# Patient Record
Sex: Female | Born: 1965 | Race: White | Hispanic: No | Marital: Married | Smoking: Never smoker
Health system: Southern US, Community
[De-identification: ages and names within clinical notes are randomized; demographics above are authoritative.]

## PROBLEM LIST (undated history)

## (undated) DIAGNOSIS — E079 Disorder of thyroid, unspecified: Secondary | ICD-10-CM

## (undated) DIAGNOSIS — I10 Essential (primary) hypertension: Secondary | ICD-10-CM

---

## 1998-05-28 ENCOUNTER — Other Ambulatory Visit: Admission: RE | Admit: 1998-05-28 | Discharge: 1998-05-28 | Payer: Self-pay | Admitting: *Deleted

## 1999-06-25 ENCOUNTER — Other Ambulatory Visit: Admission: RE | Admit: 1999-06-25 | Discharge: 1999-06-25 | Payer: Self-pay | Admitting: Obstetrics and Gynecology

## 1999-07-23 ENCOUNTER — Ambulatory Visit (HOSPITAL_COMMUNITY): Admission: RE | Admit: 1999-07-23 | Discharge: 1999-07-23 | Payer: Self-pay | Admitting: Obstetrics and Gynecology

## 1999-07-23 ENCOUNTER — Encounter: Payer: Self-pay | Admitting: Obstetrics and Gynecology

## 2000-06-24 ENCOUNTER — Other Ambulatory Visit: Admission: RE | Admit: 2000-06-24 | Discharge: 2000-06-24 | Payer: Self-pay | Admitting: Obstetrics and Gynecology

## 2000-08-12 ENCOUNTER — Encounter: Payer: Self-pay | Admitting: Obstetrics and Gynecology

## 2000-08-12 ENCOUNTER — Ambulatory Visit (HOSPITAL_COMMUNITY): Admission: RE | Admit: 2000-08-12 | Discharge: 2000-08-12 | Payer: Self-pay | Admitting: Obstetrics and Gynecology

## 2003-03-11 ENCOUNTER — Emergency Department (HOSPITAL_COMMUNITY): Admission: EM | Admit: 2003-03-11 | Discharge: 2003-03-11 | Payer: Self-pay | Admitting: Emergency Medicine

## 2003-03-11 ENCOUNTER — Encounter: Payer: Self-pay | Admitting: Emergency Medicine

## 2003-04-08 ENCOUNTER — Encounter: Admission: RE | Admit: 2003-04-08 | Discharge: 2003-04-08 | Payer: Self-pay | Admitting: Family Medicine

## 2004-12-15 ENCOUNTER — Encounter: Admission: RE | Admit: 2004-12-15 | Discharge: 2004-12-15 | Payer: Self-pay | Admitting: Family Medicine

## 2005-12-22 ENCOUNTER — Encounter: Admission: RE | Admit: 2005-12-22 | Discharge: 2005-12-22 | Payer: Self-pay | Admitting: Family Medicine

## 2008-10-02 ENCOUNTER — Encounter: Admission: RE | Admit: 2008-10-02 | Discharge: 2008-10-02 | Payer: Self-pay | Admitting: Internal Medicine

## 2010-06-15 ENCOUNTER — Encounter: Payer: Self-pay | Admitting: Internal Medicine

## 2012-03-06 ENCOUNTER — Telehealth: Payer: Self-pay | Admitting: *Deleted

## 2012-03-06 NOTE — Telephone Encounter (Signed)
Chart opened in error

## 2013-08-23 ENCOUNTER — Other Ambulatory Visit: Payer: Self-pay | Admitting: Obstetrics and Gynecology

## 2013-08-23 DIAGNOSIS — R928 Other abnormal and inconclusive findings on diagnostic imaging of breast: Secondary | ICD-10-CM

## 2013-09-05 ENCOUNTER — Ambulatory Visit
Admission: RE | Admit: 2013-09-05 | Discharge: 2013-09-05 | Disposition: A | Discharge status: Home / Self Care | Payer: BC Managed Care – PPO | Source: Ambulatory Visit | Attending: Obstetrics and Gynecology | Admitting: Obstetrics and Gynecology

## 2013-09-05 DIAGNOSIS — R928 Other abnormal and inconclusive findings on diagnostic imaging of breast: Secondary | ICD-10-CM

## 2014-02-07 ENCOUNTER — Other Ambulatory Visit: Payer: Self-pay | Admitting: Orthopedic Surgery

## 2014-02-07 DIAGNOSIS — M545 Low back pain, unspecified: Secondary | ICD-10-CM

## 2014-02-18 ENCOUNTER — Ambulatory Visit
Admission: RE | Admit: 2014-02-18 | Discharge: 2014-02-18 | Disposition: A | Discharge status: Home / Self Care | Payer: BC Managed Care – PPO | Source: Ambulatory Visit | Attending: Orthopedic Surgery | Admitting: Orthopedic Surgery

## 2014-02-18 DIAGNOSIS — M545 Low back pain, unspecified: Secondary | ICD-10-CM

## 2014-06-16 ENCOUNTER — Emergency Department (HOSPITAL_BASED_OUTPATIENT_CLINIC_OR_DEPARTMENT_OTHER): Payer: BLUE CROSS/BLUE SHIELD

## 2014-06-16 ENCOUNTER — Encounter (HOSPITAL_BASED_OUTPATIENT_CLINIC_OR_DEPARTMENT_OTHER): Payer: Self-pay | Admitting: *Deleted

## 2014-06-16 ENCOUNTER — Emergency Department (HOSPITAL_BASED_OUTPATIENT_CLINIC_OR_DEPARTMENT_OTHER)
Admission: EM | Admit: 2014-06-16 | Discharge: 2014-06-16 | Disposition: A | Discharge status: Home / Self Care | Payer: BLUE CROSS/BLUE SHIELD | Attending: Emergency Medicine | Admitting: Emergency Medicine

## 2014-06-16 DIAGNOSIS — W000XXA Fall on same level due to ice and snow, initial encounter: Secondary | ICD-10-CM | POA: Insufficient documentation

## 2014-06-16 DIAGNOSIS — Y998 Other external cause status: Secondary | ICD-10-CM | POA: Insufficient documentation

## 2014-06-16 DIAGNOSIS — S52611A Displaced fracture of right ulna styloid process, initial encounter for closed fracture: Secondary | ICD-10-CM | POA: Diagnosis not present

## 2014-06-16 DIAGNOSIS — Z79899 Other long term (current) drug therapy: Secondary | ICD-10-CM | POA: Diagnosis not present

## 2014-06-16 DIAGNOSIS — Y9389 Activity, other specified: Secondary | ICD-10-CM | POA: Insufficient documentation

## 2014-06-16 DIAGNOSIS — I10 Essential (primary) hypertension: Secondary | ICD-10-CM | POA: Insufficient documentation

## 2014-06-16 DIAGNOSIS — W19XXXA Unspecified fall, initial encounter: Secondary | ICD-10-CM

## 2014-06-16 DIAGNOSIS — S52591A Other fractures of lower end of right radius, initial encounter for closed fracture: Secondary | ICD-10-CM | POA: Insufficient documentation

## 2014-06-16 DIAGNOSIS — S6991XA Unspecified injury of right wrist, hand and finger(s), initial encounter: Secondary | ICD-10-CM | POA: Diagnosis present

## 2014-06-16 DIAGNOSIS — E079 Disorder of thyroid, unspecified: Secondary | ICD-10-CM | POA: Diagnosis not present

## 2014-06-16 DIAGNOSIS — S52501A Unspecified fracture of the lower end of right radius, initial encounter for closed fracture: Secondary | ICD-10-CM

## 2014-06-16 DIAGNOSIS — Y9289 Other specified places as the place of occurrence of the external cause: Secondary | ICD-10-CM | POA: Insufficient documentation

## 2014-06-16 HISTORY — DX: Disorder of thyroid, unspecified: E07.9

## 2014-06-16 HISTORY — DX: Essential (primary) hypertension: I10

## 2014-06-16 MED ORDER — ONDANSETRON 4 MG PO TBDP
4.0000 mg | ORAL_TABLET | Freq: Once | ORAL | Status: AC
  Administered 2014-06-16: 4 mg via ORAL
  Filled 2014-06-16: qty 1

## 2014-06-16 MED ORDER — MORPHINE SULFATE 4 MG/ML IJ SOLN
4.0000 mg | Freq: Once | INTRAMUSCULAR | Status: AC
  Administered 2014-06-16: 4 mg via INTRAMUSCULAR
  Filled 2014-06-16: qty 1

## 2014-06-16 MED ORDER — OXYCODONE-ACETAMINOPHEN 5-325 MG PO TABS
ORAL_TABLET | ORAL | Status: AC
Start: 2014-06-16 — End: ?

## 2014-06-16 NOTE — Discharge Instructions (Signed)
Rest, Ice intermittently (in the first 24-48 hours),  and elevate (Limb above the level of the heart)   Take up to 800mg  of ibuprofen (that is usually 4 over the counter pills)  3 times a day for 5 days. Take with food.  Take percocet for breakthrough pain, do not drink alcohol, drive, care for children or do other critical tasks while taking percocet.  Please follow with your primary care doctor in the next 2 days for a check-up. They must obtain records for further management.   Do not hesitate to return to the Emergency Department for any new, worsening or concerning symptoms.   Radial Fracture You have a broken bone (fracture) of the forearm. This is the part of your arm between the elbow and your wrist. Your forearm is made up of two bones. These are the radius and ulna. Your fracture is in the radial shaft. This is the bone in your forearm located on the thumb side. A cast or splint is used to protect and keep your injured bone from moving. The cast or splint will be on generally for about 5 to 6 weeks, with individual variations. HOME CARE INSTRUCTIONS   Keep the injured part elevated while sitting or lying down. Keep the injury above the level of your heart (the center of the chest). This will decrease swelling and pain.  Apply ice to the injury for 15-20 minutes, 03-04 times per day while awake, for 2 days. Put the ice in a plastic bag and place a towel between the bag of ice and your cast or splint.  Move your fingers to avoid stiffness and minimize swelling.  If you have a plaster or fiberglass cast:  Do not try to scratch the skin under the cast using sharp or pointed objects.  Check the skin around the cast every day. You may put lotion on any red or sore areas.  Keep your cast dry and clean.  If you have a plaster splint:  Wear the splint as directed.  You may loosen the elastic around the splint if your fingers become numb, tingle, or turn cold or blue.  Do not put  pressure on any part of your cast or splint. It may break. Rest your cast only on a pillow for the first 24 hours until it is fully hardened.  Your cast or splint can be protected during bathing with a plastic bag. Do not lower the cast or splint into water.  Only take over-the-counter or prescription medicines for pain, discomfort, or fever as directed by your caregiver. SEEK IMMEDIATE MEDICAL CARE IF:   Your cast gets damaged or breaks.  You have more severe pain or swelling than you did before getting the cast.  You have severe pain when stretching your fingers.  There is a bad smell, new stains and/or pus-like (purulent) drainage coming from under the cast.  Your fingers or hand turn pale or blue and become cold or your loose feeling. Document Released: 10/21/2005 Document Revised: 08/02/2011 Document Reviewed: 01/17/2006 [facility] Patient Information 2015 [place]. This information is not intended to replace advice given to you by your health care provider. Make sure you discuss any questions you have with your health care provider.

## 2014-06-16 NOTE — ED Provider Notes (Signed)
CSN: [id number]     Arrival date & time 06/16/14  1125 History   First MD Initiated Contact with Patient 06/16/14 1210     Chief Complaint  Patient presents with  . Fall  . Wrist Injury     (Consider location/radiation/quality/duration/timing/severity/associated sxs/prior Treatment) HPI   Colleen Espinoza is a 49 y.o. female complaining of 8 out of 10 right wrist pain status post slip and fall on ice prior to arrival. Pain medication taken prior to arrival. Pain is exacerbated by movement and palpation. Patient states that she slipped and fell towards her right side and impacted the hand in position of ulnar deviation. Patient denies numbness, weakness, significantly reduced range of motion. Patient is right-hand-dominant and works as a Production designer, theatre/television/filmmanager for [employer] and work involves lifting and typing frequently.  Past Medical History  Diagnosis Date  . Hypertension   . Thyroid disease    History reviewed. No pertinent past surgical history. No family history on file. History  Substance Use Topics  . Smoking status: Never Smoker   . Smokeless tobacco: Not on file  . Alcohol Use: Not on file   OB History    No data available     Review of Systems  10 systems reviewed and found to be negative, except as noted in the HPI.   Allergies  Review of patient's allergies indicates no known allergies.  Home Medications   Prior to Admission medications   Medication Sig Start Date End Date Taking? Authorizing Provider  buPROPion (WELLBUTRIN SR) 200 MG 12 hr tablet Take 200 mg by mouth 2 (two) times daily.   Yes Historical Provider, MD  estrogens conjugated, synthetic A, (CENESTIN) 0.3 MG tablet Take 0.3 mg by mouth daily.   Yes Historical Provider, MD  levothyroxine (SYNTHROID, LEVOTHROID) 100 MCG tablet Take 100 mcg by mouth daily before breakfast.   Yes Historical Provider, MD  oxyCODONE-acetaminophen (PERCOCET/ROXICET) 5-325 MG per tablet 1 to 2 tabs PO q6hrs  PRN for pain 06/16/14    Colleen Domek, PA-C   BP 157/102 mmHg  Pulse 92  Temp(Src) 98.8 F (37.1 C) (Oral)  Resp 18  Ht 5\' 5"  (1.651 m)  Wt 160 lb (72.576 kg)  BMI 26.63 kg/m2  SpO2 100% Physical Exam  Constitutional: She is oriented to person, place, and time. She appears well-developed and well-nourished. No distress.  HENT:  Head: Normocephalic and atraumatic.  Mouth/Throat: Oropharynx is clear and moist.  Eyes: Conjunctivae and EOM are normal. Pupils are equal, round, and reactive to light.  Neck: Normal range of motion.  No midline C-spine  tenderness to palpation or step-offs appreciated. Patient has full range of motion without pain.  Cardiovascular: Normal rate, regular rhythm and intact distal pulses.   Pulmonary/Chest: Effort normal and breath sounds normal. No stridor.  Abdominal: Soft.  Musculoskeletal: Normal range of motion. She exhibits edema and tenderness.       Hands: Skin is intact.  Neurological: She is alert and oriented to person, place, and time.  Psychiatric: She has a normal mood and affect.  Nursing note and vitals reviewed.   ED Course  SPLINT APPLICATION Date/Time: 06/16/2014 1:36 PM Performed by: Wynetta EmeryPISCIOTTA, Aristidis Talerico Authorized by: Wynetta EmeryPISCIOTTA, Rollen Selders Consent: Verbal consent obtained. Consent given by: patient Patient identity confirmed: verbally with patient Location details: right wrist Splint type: radial gutter. Supplies used: Ortho-Glass and elastic bandage Post-procedure: The splinted body part was neurovascularly unchanged following the procedure. Patient tolerance: Patient tolerated the procedure well with no immediate complications   (  including critical care time) Labs Review Labs Reviewed - No data to display  Imaging Review Dg Wrist Complete Right  06/16/2014   CLINICAL DATA:  Fall today.  Wrist and hand pain.  EXAM: RIGHT WRIST - COMPLETE 3+ VIEW  COMPARISON:  None.  FINDINGS: There is a minimally displaced intra-articular fracture of the distal  radius with mild displacement of the anterior cortex on the lateral view. Fracture likely extends into the distal radioulnar joint which is not widened. There is a tiny nondisplaced ulnar styloid fracture. No carpal bone fracture or subluxation identified.  IMPRESSION: Fractures of the distal radius and ulna as described.   Electronically Signed   By: Roxy Horseman M.D.   On: 06/16/2014 12:11   Dg Hand Complete Right  06/16/2014   CLINICAL DATA:  Fall on ice with right hand and wrist pain.  EXAM: RIGHT HAND - COMPLETE 3+ VIEW  COMPARISON:  None.  FINDINGS: No acute fractures identified involving the bones of the hand. Carpal bones appear intact. There is evidence of a distal radial fracture and an ulnar styloid avulsion which is better characterized on the dedicated wrist series which will be dictated separately.  IMPRESSION: No right hand fracture. Distal radial fracture and ulnar styloid avulsion fracture are visualized on the hand series. See separately dictated right wrist series.   Electronically Signed   By: Irish Lack M.D.   On: 06/16/2014 12:15     EKG Interpretation None      MDM   Final diagnoses:  Distal radius fracture, right, closed, initial encounter  Fracture of ulnar styloid, right, closed, initial encounter    Filed Vitals:   06/16/14 1159  BP: 157/102  Pulse: 92  Temp: 98.8 F (37.1 C)  TempSrc: Oral  Resp: 18  Height:  (1.651 m)  Weight: 160 lb (72.576 kg)  SpO2: 100%    Medications  morphine 4 MG/ML injection 4 mg (4 mg Intramuscular Given 06/16/14 1257)  ondansetron (ZOFRAN-ODT) disintegrating tablet 4 mg (4 mg Oral Given 06/16/14 1256)    Colleen Espinoza is a pleasant 49 y.o. female presenting with right (dominant) wrist pain status post slip and fall. X-ray reveals a minimally displaced intra-articular distal radius fracture there is also a tiny nondisplaced ulnar styloid fracture. Patient is neurovascularly intact. Will place her in a radial gutter  splint, recommend close follow-up with hand surgeon.  Evaluation does not show pathology that would require ongoing emergent intervention or inpatient treatment. Pt is hemodynamically stable and mentating appropriately. Discussed findings and plan with patient/guardian, who agrees with care plan. All questions answered. Return precautions discussed and outpatient follow up given.   New Prescriptions   OXYCODONE-ACETAMINOPHEN (PERCOCET/ROXICET) 5-325 MG PER TABLET    1 to 2 tabs PO q6hrs  PRN for pain         Wynetta Emery, PA-C 06/16/14 [address], PA-C 06/16/14 1339  Enid Skeens, MD 06/16/14 1530

## 2014-06-16 NOTE — ED Notes (Signed)
C/o fell on ice this am and c/o pain to below elbow to fingers. Swelling to rt hand and deformity to rt wrist noted. Pos pulses and nl cap refill.

## 2015-12-11 ENCOUNTER — Other Ambulatory Visit: Payer: Self-pay | Admitting: Obstetrics and Gynecology

## 2015-12-11 DIAGNOSIS — R928 Other abnormal and inconclusive findings on diagnostic imaging of breast: Secondary | ICD-10-CM

## 2015-12-16 ENCOUNTER — Ambulatory Visit
Admission: RE | Admit: 2015-12-16 | Discharge: 2015-12-16 | Disposition: A | Discharge status: Home / Self Care | Payer: BLUE CROSS/BLUE SHIELD | Source: Ambulatory Visit | Attending: Obstetrics and Gynecology | Admitting: Obstetrics and Gynecology

## 2015-12-16 DIAGNOSIS — R928 Other abnormal and inconclusive findings on diagnostic imaging of breast: Secondary | ICD-10-CM

## 2017-09-02 ENCOUNTER — Emergency Department (HOSPITAL_BASED_OUTPATIENT_CLINIC_OR_DEPARTMENT_OTHER)
Admission: EM | Admit: 2017-09-02 | Discharge: 2017-09-02 | Disposition: A | Discharge status: Home / Self Care | Payer: Managed Care, Other (non HMO) | Attending: Emergency Medicine | Admitting: Emergency Medicine

## 2017-09-02 ENCOUNTER — Encounter (HOSPITAL_BASED_OUTPATIENT_CLINIC_OR_DEPARTMENT_OTHER): Payer: Self-pay | Admitting: *Deleted

## 2017-09-02 ENCOUNTER — Other Ambulatory Visit: Payer: Self-pay

## 2017-09-02 ENCOUNTER — Emergency Department (HOSPITAL_BASED_OUTPATIENT_CLINIC_OR_DEPARTMENT_OTHER): Payer: Managed Care, Other (non HMO)

## 2017-09-02 DIAGNOSIS — I10 Essential (primary) hypertension: Secondary | ICD-10-CM | POA: Insufficient documentation

## 2017-09-02 DIAGNOSIS — R079 Chest pain, unspecified: Secondary | ICD-10-CM | POA: Diagnosis present

## 2017-09-02 DIAGNOSIS — Z79899 Other long term (current) drug therapy: Secondary | ICD-10-CM | POA: Diagnosis not present

## 2017-09-02 LAB — CBC WITH DIFFERENTIAL/PLATELET
Basophils Absolute: 0 10*3/uL (ref 0.0–0.1)
Basophils Relative: 0 %
Eosinophils Absolute: 0.1 10*3/uL (ref 0.0–0.7)
Eosinophils Relative: 1 %
HCT: 41.6 % (ref 36.0–46.0)
Hemoglobin: 14.1 g/dL (ref 12.0–15.0)
Lymphocytes Relative: 30 %
Lymphs Abs: 2.3 10*3/uL (ref 0.7–4.0)
MCH: 32.7 pg (ref 26.0–34.0)
MCHC: 33.9 g/dL (ref 30.0–36.0)
MCV: 96.5 fL (ref 78.0–100.0)
Monocytes Absolute: 0.7 10*3/uL (ref 0.1–1.0)
Monocytes Relative: 9 %
Neutro Abs: 4.6 10*3/uL (ref 1.7–7.7)
Neutrophils Relative %: 60 %
Platelets: 249 10*3/uL (ref 150–400)
RBC: 4.31 MIL/uL (ref 3.87–5.11)
RDW: 11.7 % (ref 11.5–15.5)
WBC: 7.7 10*3/uL (ref 4.0–10.5)

## 2017-09-02 LAB — COMPREHENSIVE METABOLIC PANEL
ALK PHOS: 87 U/L (ref 38–126)
ALT: 38 U/L (ref 14–54)
ANION GAP: 8 (ref 5–15)
AST: 36 U/L (ref 15–41)
Albumin: 4.2 g/dL (ref 3.5–5.0)
BUN: 20 mg/dL (ref 6–20)
CALCIUM: 9 mg/dL (ref 8.9–10.3)
CO2: 24 mmol/L (ref 22–32)
CREATININE: 1.11 mg/dL — AB (ref 0.44–1.00)
Chloride: 104 mmol/L (ref 101–111)
GFR, EST NON AFRICAN AMERICAN: 56 mL/min — AB (ref >60)
Glucose, Bld: 101 mg/dL — ABNORMAL HIGH (ref 65–99)
Potassium: 3.5 mmol/L (ref 3.5–5.1)
SODIUM: 136 mmol/L (ref 135–145)
Total Bilirubin: 0.6 mg/dL (ref 0.3–1.2)
Total Protein: 8.1 g/dL (ref 6.5–8.1)

## 2017-09-02 LAB — TROPONIN I
Troponin I: <0.03 ng/mL (ref <0.03)
Troponin I: <0.03 ng/mL (ref <0.03)

## 2017-09-02 LAB — CBG MONITORING, ED: Glucose-Capillary: 99 mg/dL (ref 65–99)

## 2017-09-02 LAB — D-DIMER, QUANTITATIVE (NOT AT ARMC)

## 2017-09-02 MED ORDER — GI COCKTAIL ~~LOC~~
30.0000 mL | Freq: Once | ORAL | Status: AC
  Administered 2017-09-02: 30 mL via ORAL
  Filled 2017-09-02: qty 30

## 2017-09-02 NOTE — ED Provider Notes (Signed)
[facility] EMERGENCY DEPARTMENT Provider Note   CSN: [id number] Arrival date & time: 09/02/17  1355     History   Chief Complaint Chief Complaint  Patient presents with  . Chest Pain    HPI Colleen Espinoza is a 52 y.o. female with a history of HTN, hypothyroid, who presents today for evaluation of chest pain.  She reports that at around 3 AM she woke up with left-sided chest pain.  She reports occasional shortness of breath and feeling warm/flushed and diaphoretic.  She reports that her pain has not moved or radiated.  She reports that she is still having the pain, that it waxes and wanes.  She reports that she started on a weight loss drug 2 days ago (Phentermine-Topiramate).  She reports that use used to be on a higher dose of the Phentermine for thirty days and did not have any adverse affects or heart racing.  She denies coughing or being sick recently.  She reports that she did eat jalapenos on her salad last night and is wondering if that may be contributing.  She reports that the feeling short of breath and nauseous has fully resolved.  She does not have any known cardiac history.    HPI  Past Medical History:  Diagnosis Date  . Hypertension   . Thyroid disease     There are no active problems to display for this patient.   History reviewed. No pertinent surgical history.   OB History   None      Home Medications    Prior to Admission medications   Medication Sig Start Date End Date Taking? Authorizing Provider  hydrochlorothiazide (MICROZIDE) 12.5 MG capsule Take 12.5 mg by mouth daily.   Yes [provider]  minocycline (DYNACIN) 100 MG tablet Take 100 mg by mouth 2 (two) times daily.   Yes [provider]  Phentermine-Topiramate (QSYMIA PO) Take by mouth.   Yes [provider]  buPROPion (WELLBUTRIN SR) 200 MG 12 hr tablet Take 200 mg by mouth 2 (two) times daily.    [provider]  estrogens conjugated, synthetic  A, (CENESTIN) 0.3 MG tablet Take 0.3 mg by mouth daily.    [provider]  levothyroxine (SYNTHROID, LEVOTHROID) 100 MCG tablet Take 100 mcg by mouth daily before breakfast.    [provider]  oxyCODONE-acetaminophen (PERCOCET/ROXICET) 5-325 MG per tablet 1 to 2 tabs PO q6hrs  PRN for pain 06/16/14   Pisciotta, Joni ReiningNicole, PA-C    Family History History reviewed. No pertinent family history.  Social History Social History   Tobacco Use  . Smoking status: Never Smoker  . Smokeless tobacco: Never Used  Substance Use Topics  . Alcohol use: Yes    Alcohol/week: 1.2 oz    Types: 2 Glasses of wine per week    Frequency: Never  . Drug use: Never     Allergies   Patient has no known allergies.   Review of Systems Review of Systems  Constitutional: Negative for chills and fever.  Eyes: Negative for visual disturbance.  Respiratory: Positive for shortness of breath.   Cardiovascular: Positive for chest pain.  Gastrointestinal: Positive for nausea. Negative for abdominal pain, constipation and vomiting.  Genitourinary: Negative for dysuria.  Neurological: Negative for weakness and headaches.  All other systems reviewed and are negative.    Physical Exam Updated Vital Signs BP 123/90   Pulse 88   Temp 98.6 F (37 C)   Resp 14   Ht 5'  5" (1.651 m)   Wt 84.8 kg (187 lb)   LMP  (LMP Unknown)   SpO2 100%   BMI 31.12 kg/m   Physical Exam  Constitutional: She appears well-developed and well-nourished. No distress.  HENT:  Head: Normocephalic and atraumatic.  Eyes: Conjunctivae are normal.  Neck: Normal range of motion. Neck supple. No JVD present.  Cardiovascular: Normal rate, regular rhythm, intact distal pulses and normal pulses.  No murmur heard. Pulses:      Radial pulses are 2+ on the right side, and 2+ on the left side.  Pulmonary/Chest: Effort normal and breath sounds normal. No tachypnea. No respiratory distress. She has no decreased breath sounds.  She has no wheezes.  Abdominal: Soft. There is no tenderness.  Musculoskeletal: She exhibits no edema.       Right lower leg: Normal. She exhibits no tenderness and no edema.       Left lower leg: Normal. She exhibits no tenderness and no edema.  Neurological: She is alert.  Skin: Skin is warm and dry.  Psychiatric: She has a normal mood and affect.  Nursing note and vitals reviewed.    ED Treatments / Results  Labs (all labs ordered are listed, but only abnormal results are displayed) Labs Reviewed  COMPREHENSIVE METABOLIC PANEL - Abnormal; Notable for the following components:      Result Value   Glucose, Bld 101 (*)    Creatinine, Ser 1.11 (*)    GFR calc non Af Amer 56 (*)    All other components within normal limits  TROPONIN I  TROPONIN I  CBC WITH DIFFERENTIAL/PLATELET  D-DIMER, QUANTITATIVE (NOT AT [facility])  CBG MONITORING, ED    EKG EKG Interpretation  Date/Time:  Friday September 02 2017 14:00:49 EDT Ventricular Rate:  114 PR Interval:  128 QRS Duration: 78 QT Interval:  334 QTC Calculation: 460 R Axis:   42 Text Interpretation:  Sinus tachycardia with occasional Premature ventricular complexes and Fusion complexes Cannot rule out Anterior infarct , age undetermined Abnormal ECG Since previous tracing PVCs are new Confirmed by Jerelyn Scott [phone]) on 09/02/2017 2:04:32 PM Also confirmed by Jerelyn Scott [phone]), editor Sheppard Evens [phone])  on 09/02/2017 3:22:37 PM   Radiology Dg Chest 2 View  Result Date: 09/02/2017 CLINICAL DATA:  Chest pain and hypertension EXAM: CHEST - 2 VIEW COMPARISON:  None. FINDINGS: Lungs are clear. Heart size and pulmonary vascularity are normal. No adenopathy. No pneumothorax. No bone lesions. IMPRESSION: No edema or consolidation. Electronically Signed   By: Bretta Bang III M.D.   On: 09/02/2017 15:23    Procedures Procedures (including critical care time)  Medications Ordered in ED Medications  gi cocktail  (Maalox,Lidocaine,Donnatal) (30 mLs Oral Given 09/02/17 1609)     Initial Impression / Assessment and Plan / ED Course  I have reviewed the triage vital signs and the nursing notes.  Pertinent labs & imaging results that were available during my care of the patient were reviewed by me and considered in my medical decision making (see chart for details).    Patient is to be discharged with recommendation to follow up with PCP in regards to today's hospital visit. Chest pain is not likely of cardiac or pulmonary etiology d/t presentation, PERC negative, VSS, no tracheal deviation, no JVD or new murmur, RRR, breath sounds equal bilaterally, EKG without acute abnormalities, negative troponin, and negative CXR. Pt has been advised to return to the ED if CP becomes exertional, associated with diaphoresis or nausea, radiates  to left jaw/arm, worsens or becomes concerning in any way. Pt appears reliable for follow up and is agreeable to discharge. Patient advised to limit her caffeine and to stop taking the weight loss drug.  Case has been discussed with Dr. Anitra Lauth who agrees with the above plan to discharge.    Final Clinical Impressions(s) / ED Diagnoses   Final diagnoses:  Chest pain, unspecified type    ED Discharge Orders    None       Norman Clay 09/02/17 Forestine Chute, MD 09/02/17 [phone]

## 2017-09-02 NOTE — ED Triage Notes (Addendum)
Pt c/o chest pain x 12 hrs , SOB, diaphoretic , recently started x 2 days ago on weigh loss drug

## 2017-09-02 NOTE — Discharge Instructions (Addendum)
Please follow up with your doctor.  If you develop worsening chest pain, shortness of breath, vomiting, your pain worsens with exercise, radiates up to your jaw, down your arm, or you have any concerns please [facility] [place].  Please stop taking your Qsymia.

## 2019-08-02 ENCOUNTER — Ambulatory Visit: Payer: Managed Care, Other (non HMO) | Attending: Internal Medicine

## 2019-08-02 DIAGNOSIS — Z23 Encounter for immunization: Secondary | ICD-10-CM

## 2019-08-02 NOTE — Progress Notes (Signed)
   Covid-19 Vaccination Clinic  Name:  Colleene Swarthout    MRN: [id number] DOB: Mar 23, 1966  08/02/2019  Ms. Speckman was observed post Covid-19 immunization for 15 minutes without incident. She was provided with Vaccine Information Sheet and instruction to access the V-Safe system.   Ms. Groll was instructed to call 911 with any severe reactions post vaccine: Marland Kitchen Difficulty breathing  . Swelling of face and throat  . A fast heartbeat  . A bad rash all over body  . Dizziness and weakness   Immunizations Administered    Name Date Dose VIS Date Route   Pfizer COVID-19 Vaccine 08/02/2019  1:53 PM 0.3 mL 05/04/2019 Intramuscular   Manufacturer: [employer], [employer]   Lot: [id number]   NDC: [address]-0

## 2019-08-27 ENCOUNTER — Ambulatory Visit: Payer: Managed Care, Other (non HMO) | Attending: Internal Medicine

## 2019-08-27 DIAGNOSIS — Z23 Encounter for immunization: Secondary | ICD-10-CM

## 2019-08-27 NOTE — Progress Notes (Signed)
   Covid-19 Vaccination Clinic  Name:  Colleen Espinoza    MRN: [id number] DOB: 06/27/1965  08/27/2019  Colleen Espinoza was observed post Covid-19 immunization for 15 minutes without incident. She was provided with Vaccine Information Sheet and instruction to access the V-Safe system.   Colleen Espinoza was instructed to call 911 with any severe reactions post vaccine: Marland Kitchen Difficulty breathing  . Swelling of face and throat  . A fast heartbeat  . A bad rash all over body  . Dizziness and weakness   Immunizations Administered    Name Date Dose VIS Date Route   Pfizer COVID-19 Vaccine 08/27/2019  2:18 PM 0.3 mL 05/04/2019 Intramuscular   Manufacturer: [employer], [employer]   Lot: [id number]   NDC: [address]-8

## 2020-03-17 ENCOUNTER — Other Ambulatory Visit: Payer: Self-pay | Admitting: Obstetrics and Gynecology

## 2020-03-17 DIAGNOSIS — R928 Other abnormal and inconclusive findings on diagnostic imaging of breast: Secondary | ICD-10-CM

## 2020-04-08 ENCOUNTER — Other Ambulatory Visit: Payer: Managed Care, Other (non HMO)

## 2020-04-28 ENCOUNTER — Other Ambulatory Visit: Payer: Managed Care, Other (non HMO)

## 2020-05-21 ENCOUNTER — Ambulatory Visit
Admission: RE | Admit: 2020-05-21 | Discharge: 2020-05-21 | Disposition: A | Discharge status: Home / Self Care | Payer: Managed Care, Other (non HMO) | Source: Ambulatory Visit | Attending: Obstetrics and Gynecology | Admitting: Obstetrics and Gynecology

## 2020-05-21 ENCOUNTER — Other Ambulatory Visit: Payer: Self-pay

## 2020-05-21 ENCOUNTER — Ambulatory Visit: Payer: Managed Care, Other (non HMO)

## 2020-05-21 DIAGNOSIS — R928 Other abnormal and inconclusive findings on diagnostic imaging of breast: Secondary | ICD-10-CM

## 2020-09-24 ENCOUNTER — Other Ambulatory Visit: Payer: Self-pay | Admitting: Internal Medicine

## 2020-09-24 DIAGNOSIS — E785 Hyperlipidemia, unspecified: Secondary | ICD-10-CM

## 2020-10-29 ENCOUNTER — Ambulatory Visit
Admission: RE | Admit: 2020-10-29 | Discharge: 2020-10-29 | Disposition: A | Discharge status: Home / Self Care | Payer: Managed Care, Other (non HMO) | Source: Ambulatory Visit | Attending: Internal Medicine | Admitting: Internal Medicine

## 2020-10-29 DIAGNOSIS — E785 Hyperlipidemia, unspecified: Secondary | ICD-10-CM

## 2021-12-26 IMAGING — CT CT CARDIAC CORONARY ARTERY CALCIUM SCORE
3 series · 12 of 20 positions shown, 14 images · non-contrast
Comparison: None.

CLINICAL DATA: 54-year-old Caucasian female with history of
hyperlipidemia and family history of heart disease.

EXAM:
CT CARDIAC CORONARY ARTERY CALCIUM SCORE
TECHNIQUE: Non-contrast imaging through the heart was performed using
prospective ECG gating. Image post processing was performed on an
independent workstation, allowing for quantitative analysis of the
heart and coronary arteries. Note that this exam targets the heart
and the chest was not imaged in its entirety.

[Series 2: calcium scoring 2.00 qr36 bestdiast 70% hrt calciu · axial · 0.37mm/px · z∈[+1652,+1676]mm · 2 of 60 slices shown]
[im 12/60  vessel]
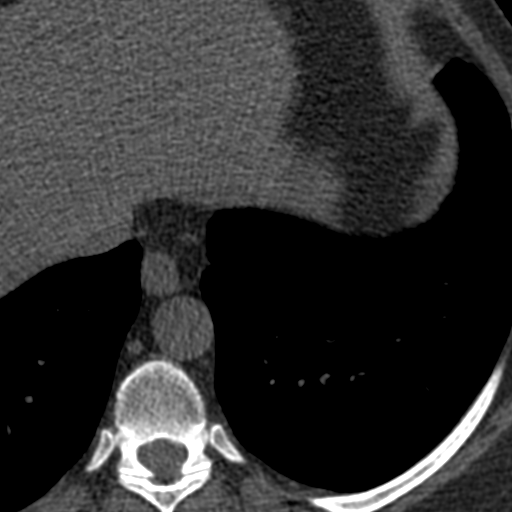
[im 24/60  vessel]
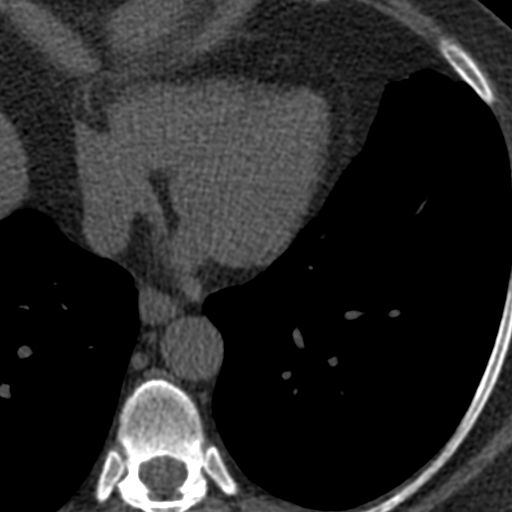

[Series 3: calcium scoring 2.00 br40 bestdiast 70% axial · axial · 0.47mm/px · z∈[+1648,+1728]mm · 5 of 60 slices shown, 7 images]
[im 10/60  vessel]
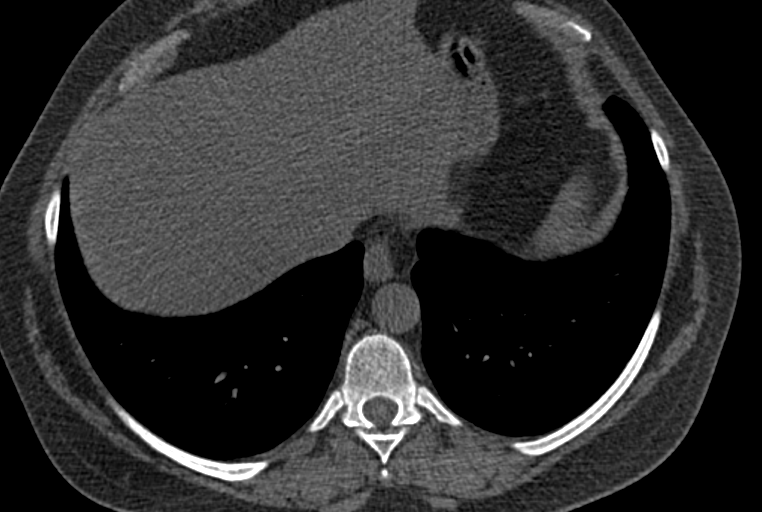
[im 10/60  lung]
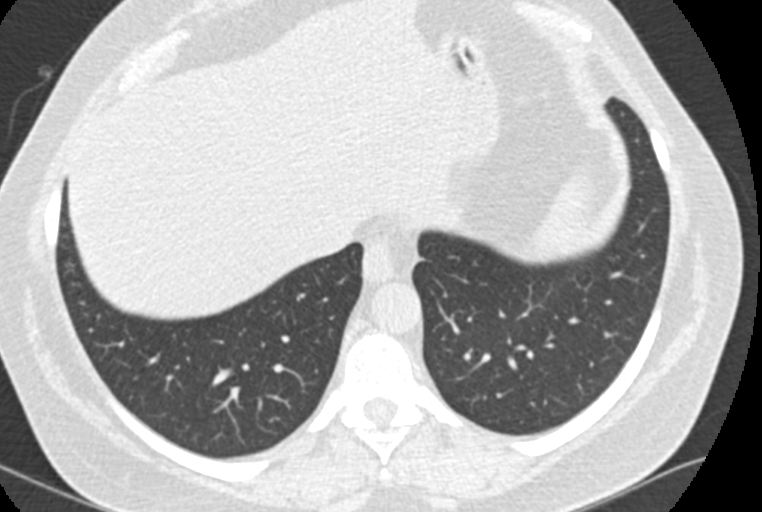
[im 20/60  vessel]
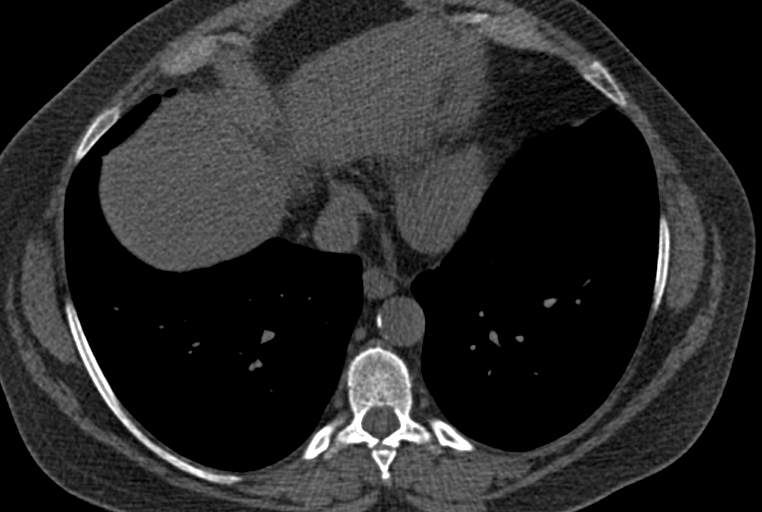
[im 30/60  vessel]
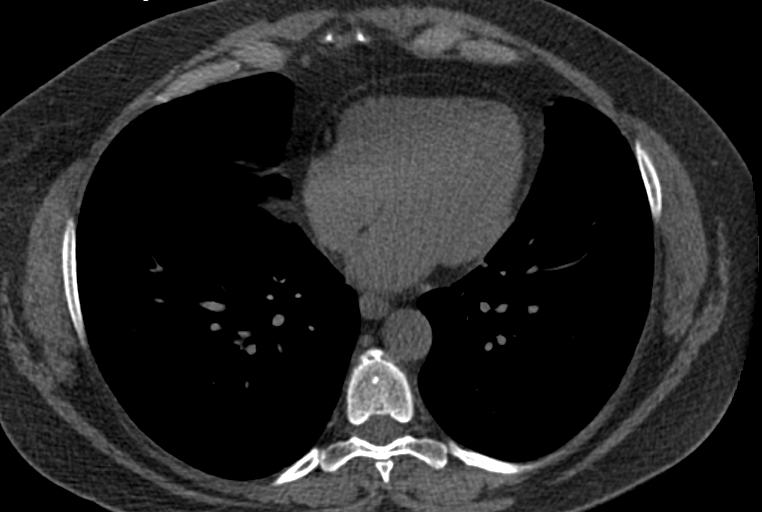
[im 40/60  vessel]
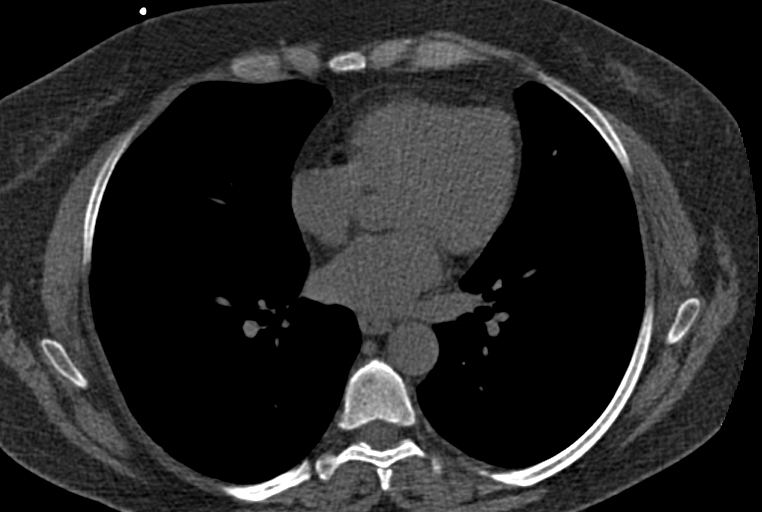
[im 50/60  vessel]
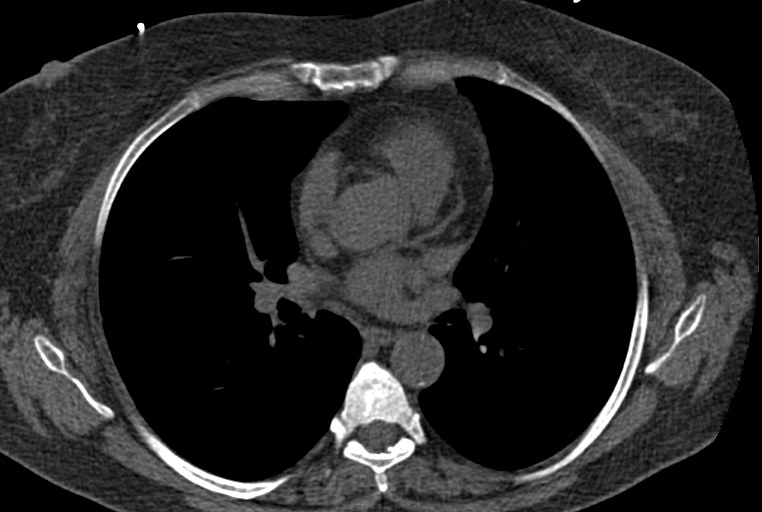
[im 50/60  lung]
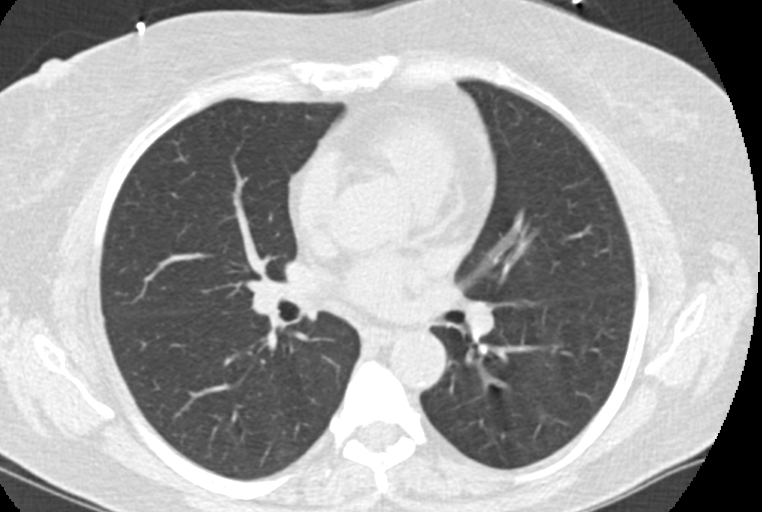

[Series 9: calcium scoring 2.00 br60 bestdiast 70% lungs · axial · 0.48mm/px · z∈[+1648,+1728]mm · 5 of 60 slices shown]
[im 10/60  vessel]
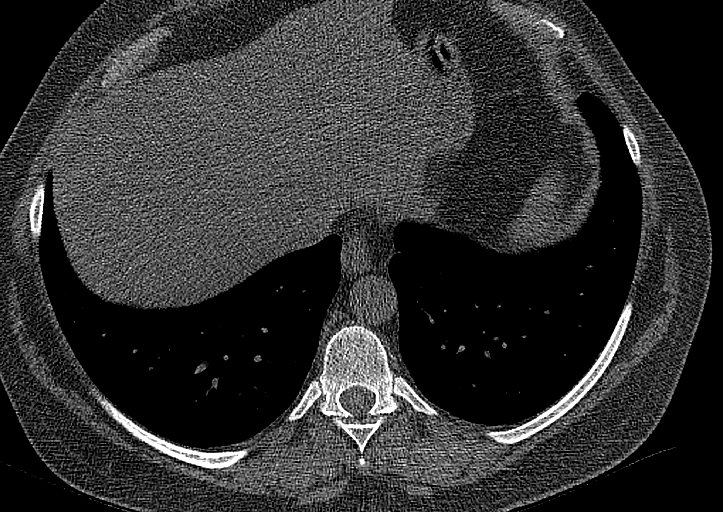
[im 20/60  vessel]
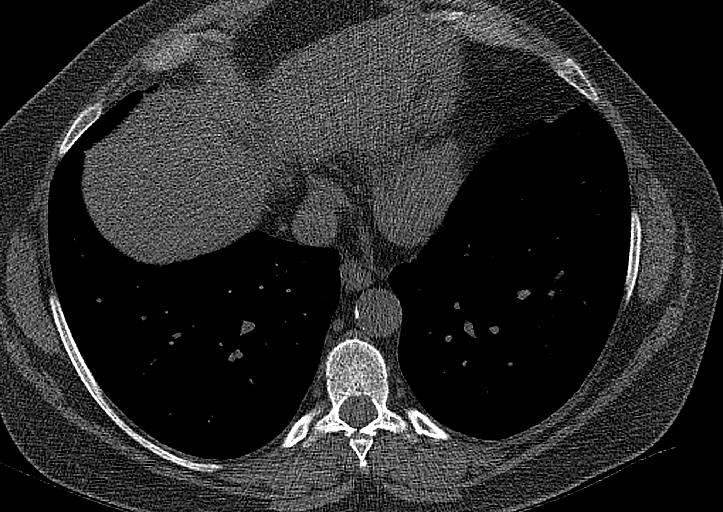
[im 30/60  vessel]
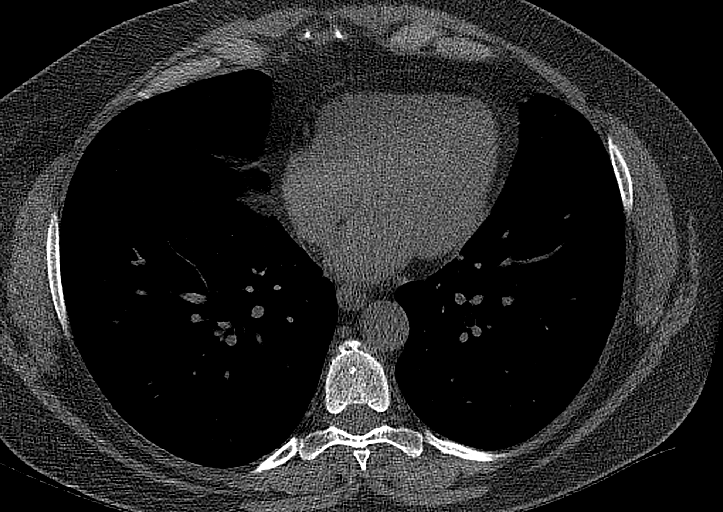
[im 40/60  vessel]
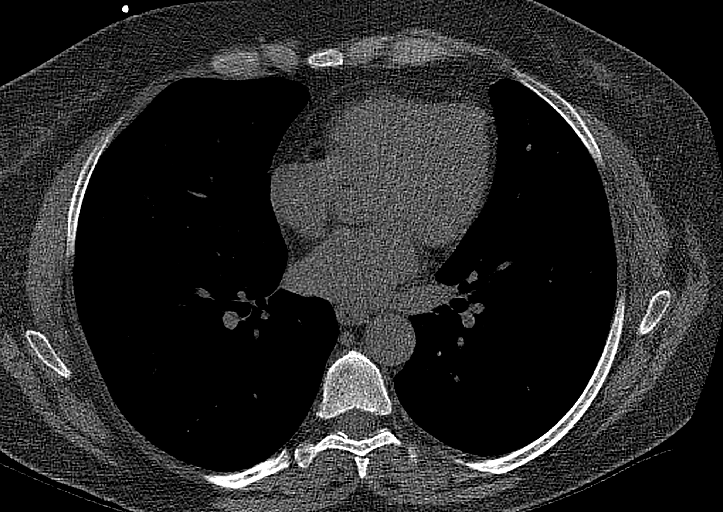
[im 50/60  vessel]
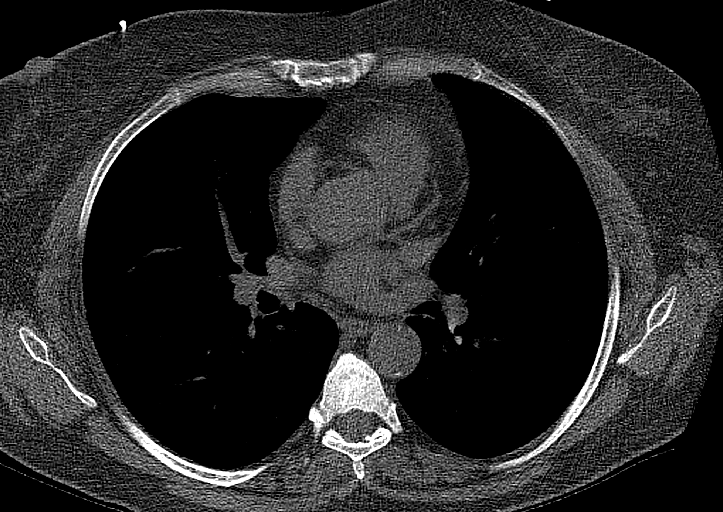

[12 of 20 positions shown; findings below may reference images not displayed]

FINDINGS: CORONARY CALCIUM SCORES:

Left Main: 0

LAD: 0

LCx: 0

RCA: 0

Total Agatston Score: 0

[HOSPITAL] percentile: 0

AORTA MEASUREMENTS:

Ascending Aorta: 33 mm

Descending Aorta: 26 mm

OTHER FINDINGS:

The heart size is within normal limits. No pericardial fluid
identified. Visualized segments of the thoracic aorta and central
pulmonary arteries are normal in caliber. Visualized mediastinum and
hilar regions demonstrate no lymphadenopathy or masses. Visualized
lungs show no evidence of pulmonary edema, consolidation,
pneumothorax, nodule or pleural fluid. Visualized upper abdomen and
bony structures are unremarkable.
IMPRESSION: Coronary calcium score of 0.
